# Patient Record
Sex: Female | Born: 1995 | Race: White | Hispanic: No | Marital: Single | Smoking: Never smoker
Health system: Southern US, Community
[De-identification: ages and names within clinical notes are randomized; demographics above are authoritative.]

## PROBLEM LIST (undated history)

## (undated) DIAGNOSIS — F419 Anxiety disorder, unspecified: Secondary | ICD-10-CM

## (undated) DIAGNOSIS — J45909 Unspecified asthma, uncomplicated: Secondary | ICD-10-CM

---
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
## 2017-06-26 ENCOUNTER — Ambulatory Visit (INDEPENDENT_AMBULATORY_CARE_PROVIDER_SITE_OTHER): Payer: BLUE CROSS/BLUE SHIELD | Admitting: Psychiatry

## 2017-06-26 ENCOUNTER — Encounter: Payer: Self-pay | Admitting: Psychiatry

## 2017-06-26 ENCOUNTER — Other Ambulatory Visit: Payer: Self-pay

## 2017-06-26 VITALS — BP 111/77 | HR 94 | Temp 97.7°F | Wt 90.0 lb

## 2017-06-26 DIAGNOSIS — J45909 Unspecified asthma, uncomplicated: Secondary | ICD-10-CM | POA: Insufficient documentation

## 2017-06-26 DIAGNOSIS — F411 Generalized anxiety disorder: Secondary | ICD-10-CM | POA: Diagnosis not present

## 2017-06-26 MED ORDER — ESCITALOPRAM OXALATE 5 MG PO TABS: 5.0000 mg | ORAL_TABLET | Freq: Every day | ORAL | 1 refills | Status: DC

## 2017-06-26 MED ORDER — HYDROXYZINE HCL 10 MG PO TABS: 10.0000 mg | ORAL_TABLET | Freq: Two times a day (BID) | ORAL | 1 refills | Status: DC | PRN

## 2017-06-26 NOTE — Progress Notes (Signed)
Psychiatric Initial Adult Assessment   Patient Identification: Natasha HuntsmanStephanie Diaz MRN:  [id number] Date of Evaluation:  06/26/2017 Referral Source: Imagene GurneyBruce Greene DO Chief Complaint:  ' I am anxious.' Chief Complaint    Establish Care; Anxiety     Visit Diagnosis:    ICD-10-CM   1. GAD (generalized anxiety disorder) F41.1 escitalopram (LEXAPRO) 5 MG tablet    hydrOXYzine (ATARAX/VISTARIL) 10 MG tablet    History of Present Illness:  Natasha Diaz is a 22 yr old CF, single ,who has a hx of GAD , is a Consulting civil engineerstudent at [employer] , originally from [place], presented to the clinic today to establish care.  Natasha Diaz today reports that she has been struggling with panic symptoms and anxiety attacks since the past 7 years or so.  She reports 7 years ago she had an asthma attack which turned into a panic attack.  She reports she had another panic attack several years ago when she had another stressor.  She reports she has not had any panic attacks ever since.  She however continues to struggle with anxiety symptoms.  She describes her anxiety symptoms as feeling nervous, anxious, on edge, not being able to stop worrying, trouble relaxing and having racing thoughts.  She reports that it does not happen a lot but she still struggles with it.  She reports she recently went abroad to [place] in January [place].  She reports that at that time she felt extremely nervous and on the edge.  She was prescribed BuSpar as needed by a psychiatrist in [place].  She reports she  tried taking the BuSpar and did not feel like it helped at all.  Natasha Diaz otherwise denies any significant problems.  She denies depressed.  She denies sleep problems.  She denies psychosis.  She denies manic or hypomanic symptoms.  She denies any past history of trauma.  She does drink alcohol socially on the weekends.  She denies abusing any other drugs or alcohol.  She is doing her third year of college at [employer], majoring in  Investment banker, corporatepolitical science at this time.  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    Associated Signs/Symptoms: Depression Symptoms:  difficulty concentrating, anxiety, panic attacks, (Hypo) Manic Symptoms:  denies Anxiety Symptoms:  nervousness Psychotic Symptoms:  denies PTSD Symptoms: Negative  Past Psychiatric History: Denies any history of inpatient mental health admissions.  She denies any history of suicide attempts.  Does report she was treated by a psychiatrist Dr. Nathanial MillmanBruce Green in [place].  She also got psychotherapy in the past while in [place].  Previous Psychotropic Medications: Yes ,BuSpar 5 mg as needed.  Substance Abuse History in the last 12 months:  No.  Consequences of Substance Abuse: Negative  Past Medical History:  Past Medical History:  Diagnosis Date  . Anxiety   . Asthma    History reviewed. No pertinent surgical history.  Family Psychiatric History: Sister-OCD, another sibling(transgender) -OCD, maternal grandmother-depression, maternal uncle-depression, paternal aunt-anxiety.  Family History:  Family History  Problem Relation Age of Onset  . Anxiety disorder Sister   . Anxiety disorder Paternal Aunt   . Depression Maternal Uncle   . Depression Maternal Grandmother   . Anxiety disorder Cousin   . Anxiety disorder Sister     Social History:   Social History   Socioeconomic History  . Marital status: Single    Spouse name: None  .  Number of children: 0  . Years of education: None  . Highest education level: Some college, no degree  Social Needs  . Financial resource strain: Not hard at all  . Food insecurity - worry: Never true  . Food insecurity - inability: Never true  . Transportation needs - medical: No  . Transportation needs - non-medical: No  Occupational History    Comment: part time  Tobacco Use  . Smoking status: Never Smoker  . Smokeless tobacco: Never Used   Substance and Sexual Activity  . Alcohol use: Yes    Alcohol/week: 3.0 oz    Types: 3 Glasses of wine, 1 Cans of beer, 1 Shots of liquor per week  . Drug use: No  . Sexual activity: Yes    Birth control/protection: Condom  Other Topics Concern  . None  Social History Narrative  . None    Additional Social History: She was born and raised in [place].  She reports her parents as very supportive.  She has 2 siblings and she has a good relationship with them.  She is currently in her third year of college at [employer].  She is Glass blower/designer in Investment banker, corporate.  She would like to start an internship in the summer and looks forward to starting a job possibly in [place].  Allergies:  No Known Allergies  Metabolic Disorder Labs: No results found for: HGBA1C, MPG No results found for: PROLACTIN No results found for: CHOL, TRIG, HDL, CHOLHDL, VLDL, LDLCALC   Current Medications: Current Outpatient Medications  Medication Sig Dispense Refill  . Albuterol (VENTOLIN IN) Inhale into the lungs.    . ciclesonide (ALVESCO) 80 MCG/ACT inhaler Inhale 1 puff into the lungs 2 (two) times daily.    Marland Kitchen escitalopram (LEXAPRO) 5 MG tablet Take 1 tablet (5 mg total) by mouth daily. 30 tablet 1  . hydrOXYzine (ATARAX/VISTARIL) 10 MG tablet Take 1-2 tablets (10-20 mg total) by mouth 2 (two) times daily as needed (only for severe anxiety sx). 120 tablet 1   No current facility-administered medications for this visit.     Neurologic: Headache: No Seizure: No Paresthesias:No  Musculoskeletal: Strength & Muscle Tone: within normal limits Gait & Station: normal Patient leans: N/A  Psychiatric Specialty Exam: Review of Systems  Psychiatric/Behavioral: The patient is nervous/anxious.   All other systems reviewed and are negative.   Blood pressure 111/77, pulse 94, temperature 97.7 F (36.5 C), temperature source Oral, weight 90 lb (40.8 kg), last menstrual period 06/12/2017.Body mass  index is 16.46 kg/m.  General Appearance: Casual  Eye Contact:  Fair  Speech:  Normal Rate  Volume:  Normal  Mood:  Anxious  Affect:  Congruent  Thought Process:  Goal Directed and Descriptions of Associations: Intact  Orientation:  Full (Time, Place, and Person)  Thought Content:  Logical  Suicidal Thoughts:  No  Homicidal Thoughts:  No  Memory:  Immediate;   Fair Recent;   Fair Remote;   Fair  Judgement:  Fair  Insight:  Fair  Psychomotor Activity:  Normal  Concentration:  Concentration: Fair and Attention Span: Fair  Recall:  [employer] of Knowledge:Fair  Language: Fair  Akathisia:  No  Handed:  Right  AIMS (if indicated): NA  Assets:  Communication Skills Desire for Improvement Financial Resources/Insurance Housing Leisure Time Physical Health Resilience Social Support Talents/Skills Transportation Vocational/Educational  ADL's:  Intact  Cognition: WNL  Sleep:  fair    Treatment Plan Summary: Khushboo is a 22 year old Caucasian female,  single, student at [employer], presented to the clinic today to establish care.  Tawanda has a history of generalized anxiety disorder.  She is currently not on any medication except for BuSpar as needed which she reports is not helpful.  She does have a biological predisposition given her family history of mental health problems.  She currently denies any history of trauma or any substance abuse problems.  Discussed medication readjustment as well as possible psychotherapy referral with patient.  Patient reports she is not interested in psychotherapy at this time.  Plan as noted below. Medication management and Plan as noted below Plan  GAD Start Lexapro 5 mg p.o. daily Provided medication education, provided handouts. Discussed the interaction between alcohol and medications like Lexapro. Will discontinue BuSpar for lack of efficacy. Start hydroxyzine 10-20 mg p.o. twice daily as needed  Discussed referral for CBT, she  reports she is not interested.  Will get labs-TSH if not already done.  Follow-up in clinic in 6 weeks or sooner if needed.  More than 50 % of the time was spent for psychoeducation and supportive psychotherapy and care coordination.  This note was generated in part or whole with voice recognition software. Voice recognition is usually quite accurate but there are transcription errors that can and very often do occur. I apologize for any typographical errors that were not detected and corrected.      Jomarie Longs, MD 3/14/20198:59 AM

## 2017-06-26 NOTE — Patient Instructions (Signed)
Hydroxyzine capsules or tablets What is this medicine? HYDROXYZINE (hye [place] i zeen) is an antihistamine. This medicine is used to treat allergy symptoms. It is also used to treat anxiety and tension. This medicine can be used with other medicines to induce sleep before surgery. This medicine may be used for other purposes; ask your health care provider or pharmacist if you have questions. COMMON BRAND NAME(S): ANX, Atarax, Rezine, Vistaril What should I tell my health care provider before I take this medicine? They need to know if you have any of these conditions: -any chronic illness -difficulty passing urine -glaucoma -heart disease -kidney disease -liver disease -lung disease -an unusual or allergic reaction to hydroxyzine, cetirizine, other medicines, foods, dyes, or preservatives -pregnant or trying to get pregnant -breast-feeding How should I use this medicine? Take this medicine by mouth with a full glass of water. Follow the directions on the prescription label. You may take this medicine with food or on an empty stomach. Take your medicine at regular intervals. Do not take your medicine more often than directed. Talk to your pediatrician regarding the use of this medicine in children. Special care may be needed. While this drug may be prescribed for children as young as 75 years of age for selected conditions, precautions do apply. Patients over 62 years old may have a stronger reaction and need a smaller dose. Overdosage: If you think you have taken too much of this medicine contact a poison control center or emergency room at once. NOTE: This medicine is only for you. Do not share this medicine with others. What if I miss a dose? If you miss a dose, take it as soon as you can. If it is almost time for your next dose, take only that dose. Do not take double or extra doses. What may interact with this medicine? -alcohol -barbiturate medicines for sleep or seizures -medicines for  colds, allergies -medicines for depression, anxiety, or emotional disturbances -medicines for pain -medicines for sleep -muscle relaxants This list may not describe all possible interactions. Give your health care provider a list of all the medicines, herbs, non-prescription drugs, or dietary supplements you use. Also tell them if you smoke, drink alcohol, or use illegal drugs. Some items may interact with your medicine. What should I watch for while using this medicine? Tell your doctor or health care professional if your symptoms do not improve. You may get drowsy or dizzy. Do not drive, use machinery, or do anything that needs mental alertness until you know how this medicine affects you. Do not stand or sit up quickly, especially if you are an older patient. This reduces the risk of dizzy or fainting spells. Alcohol may interfere with the effect of this medicine. Avoid alcoholic drinks. Your mouth may get dry. Chewing sugarless gum or sucking hard candy, and drinking plenty of water may help. Contact your doctor if the problem does not go away or is severe. This medicine may cause dry eyes and blurred vision. If you wear contact lenses you may feel some discomfort. Lubricating drops may help. See your eye doctor if the problem does not go away or is severe. If you are receiving skin tests for allergies, tell your doctor you are using this medicine. What side effects may I notice from receiving this medicine? Side effects that you should report to your doctor or health care professional as soon as possible: -fast or irregular heartbeat -difficulty passing urine -seizures -slurred speech or confusion -tremor Side effects that  usually do not require medical attention (report to your doctor or health care professional if they continue or are bothersome): -constipation -drowsiness -fatigue -headache -stomach upset This list may not describe all possible side effects. Call your doctor for  medical advice about side effects. You may report side effects to FDA at 1-800-FDA-1088. Where should I keep my medicine? Keep out of the reach of children. Store at room temperature between 15 and 30 degrees C (59 and 86 degrees F). Keep container tightly closed. Throw away any unused medicine after the expiration date. NOTE: This sheet is a summary. It may not cover all possible information. If you have questions about this medicine, talk to your doctor, pharmacist, or health care provider.  2018 Elsevier/Gold Standard (2007-08-15 14:50:59) Escitalopram tablets What is this medicine? ESCITALOPRAM (es sye TAL oh pram) is used to treat depression and certain types of anxiety. This medicine may be used for other purposes; ask your health care provider or pharmacist if you have questions. COMMON BRAND NAME(S): Lexapro What should I tell my health care provider before I take this medicine? They need to know if you have any of these conditions: -bipolar disorder or a family history of bipolar disorder -diabetes -glaucoma -heart disease -kidney or liver disease -receiving electroconvulsive therapy -seizures (convulsions) -suicidal thoughts, plans, or attempt by you or a family member -an unusual or allergic reaction to escitalopram, the related drug citalopram, other medicines, foods, dyes, or preservatives -pregnant or trying to become pregnant -breast-feeding How should I use this medicine? Take this medicine by mouth with a glass of water. Follow the directions on the prescription label. You can take it with or without food. If it upsets your stomach, take it with food. Take your medicine at regular intervals. Do not take it more often than directed. Do not stop taking this medicine suddenly except upon the advice of your doctor. Stopping this medicine too quickly may cause serious side effects or your condition may worsen. A special MedGuide will be given to you by the pharmacist with each  prescription and refill. Be sure to read this information carefully each time. Talk to your pediatrician regarding the use of this medicine in children. Special care may be needed. Overdosage: If you think you have taken too much of this medicine contact a poison control center or emergency room at once. NOTE: This medicine is only for you. Do not share this medicine with others. What if I miss a dose? If you miss a dose, take it as soon as you can. If it is almost time for your next dose, take only that dose. Do not take double or extra doses. What may interact with this medicine? Do not take this medicine with any of the following medications: -certain medicines for fungal infections like fluconazole, itraconazole, ketoconazole, posaconazole, voriconazole -cisapride -citalopram -dofetilide -dronedarone -linezolid -MAOIs like Carbex, Eldepryl, Marplan, Nardil, and Parnate -methylene blue (injected into a vein) -pimozide -thioridazine -ziprasidone This medicine may also interact with the following medications: -alcohol -amphetamines -aspirin and aspirin-like medicines -carbamazepine -certain medicines for depression, anxiety, or psychotic disturbances -certain medicines for migraine headache like almotriptan, eletriptan, frovatriptan, naratriptan, rizatriptan, sumatriptan, zolmitriptan -certain medicines for sleep -certain medicines that treat or prevent blood clots like warfarin, enoxaparin, dalteparin -cimetidine -diuretics -fentanyl -furazolidone -isoniazid -lithium -metoprolol -NSAIDs, medicines for pain and inflammation, like ibuprofen or naproxen -other medicines that prolong the QT interval (cause an abnormal heart rhythm) -procarbazine -rasagiline -supplements like St. John's wort, kava kava, valerian -tramadol -tryptophan  This list may not describe all possible interactions. Give your health care provider a list of all the medicines, herbs, non-prescription drugs,  or dietary supplements you use. Also tell them if you smoke, drink alcohol, or use illegal drugs. Some items may interact with your medicine. What should I watch for while using this medicine? Tell your doctor if your symptoms do not get better or if they get worse. Visit your doctor or health care professional for regular checks on your progress. Because it may take several weeks to see the full effects of this medicine, it is important to continue your treatment as prescribed by your doctor. Patients and their families should watch out for new or worsening thoughts of suicide or depression. Also watch out for sudden changes in feelings such as feeling anxious, agitated, panicky, irritable, hostile, aggressive, impulsive, severely restless, overly excited and hyperactive, or not being able to sleep. If this happens, especially at the beginning of treatment or after a change in dose, call your health care professional. Bonita QuinYou may get drowsy or dizzy. Do not drive, use machinery, or do anything that needs mental alertness until you know how this medicine affects you. Do not stand or sit up quickly, especially if you are an older patient. This reduces the risk of dizzy or fainting spells. Alcohol may interfere with the effect of this medicine. Avoid alcoholic drinks. Your mouth may get dry. Chewing sugarless gum or sucking hard candy, and drinking plenty of water may help. Contact your doctor if the problem does not go away or is severe. What side effects may I notice from receiving this medicine? Side effects that you should report to your doctor or health care professional as soon as possible: -allergic reactions like skin rash, itching or hives, swelling of the face, lips, or tongue -anxious -black, tarry stools -changes in vision -confusion -elevated mood, decreased need for sleep, racing thoughts, impulsive behavior -eye pain -fast, irregular heartbeat -feeling faint or lightheaded, falls -feeling  agitated, angry, or irritable -hallucination, loss of contact with reality -loss of balance or coordination -loss of memory -painful or prolonged erections -restlessness, pacing, inability to keep still -seizures -stiff muscles -suicidal thoughts or other mood changes -trouble sleeping -unusual bleeding or bruising -unusually weak or tired -vomiting Side effects that usually do not require medical attention (report to your doctor or health care professional if they continue or are bothersome): -changes in appetite -change in sex drive or performance -headache -increased sweating -indigestion, nausea -tremors This list may not describe all possible side effects. Call your doctor for medical advice about side effects. You may report side effects to FDA at 1-800-FDA-1088. Where should I keep my medicine? Keep out of reach of children. Store at room temperature between 15 and 30 degrees C (59 and 86 degrees F). Throw away any unused medicine after the expiration date. NOTE: This sheet is a summary. It may not cover all possible information. If you have questions about this medicine, talk to your doctor, pharmacist, or health care provider.  2018 Elsevier/Gold Standard (2015-09-05 13:20:23)

## 2017-06-27 ENCOUNTER — Encounter: Payer: Self-pay | Admitting: Psychiatry

## 2017-07-03 ENCOUNTER — Telehealth: Payer: Self-pay

## 2017-07-03 NOTE — Telephone Encounter (Signed)
pt states that she has not started taking the hydroxyzine or the lexapro and she was wondering if she can take buspar with that medication.   I asked pt who gave her the buspar and she stated her previous psych doctor.   She states she is feeling anxious

## 2017-07-03 NOTE — Telephone Encounter (Signed)
Yes, patient opted not to start meds that I gave her last visit since she was going away. She had told me she does not think buspar as helping and wanted to stop it. However , its OK to continue buspar along with those meds I gave her . pls let her know.thanks.

## 2017-07-05 NOTE — Telephone Encounter (Signed)
Two messages was left for pt.  1st message was for pt to call office back.  Since have not heard back from pt.  2nd message stated that it was ok for her to take buspar along with the other medication.

## 2017-07-28 IMAGING — CR DG ANKLE COMPLETE 3+V*L*
1 series · 3 of 3 positions shown · non-contrast
Comparison: None.

CLINICAL DATA: Jumping injury with lateral ankle pain, initial
encounter

EXAM:
LEFT ANKLE COMPLETE - 3+ VIEW

[Series 1: ap · 0.17mm/px · 3 of 3 slices shown]
[im 1/3]
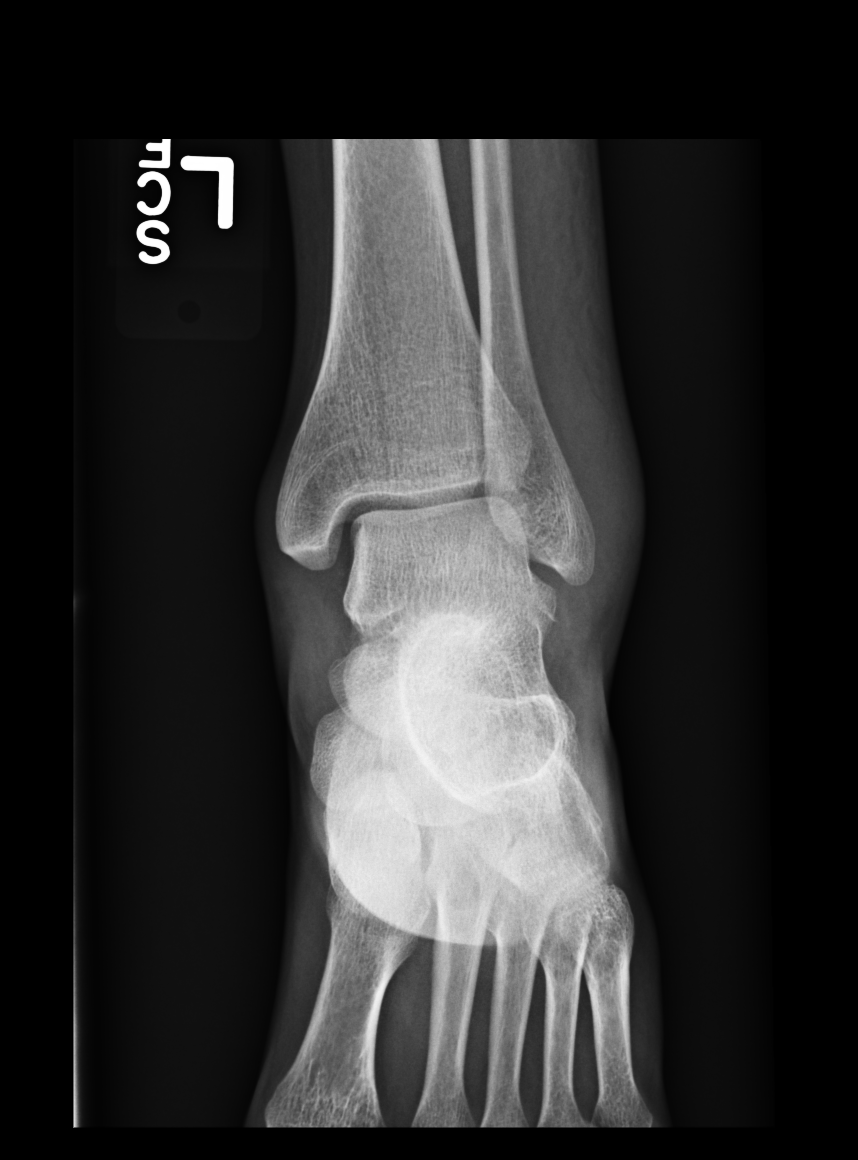
[im 2/3]
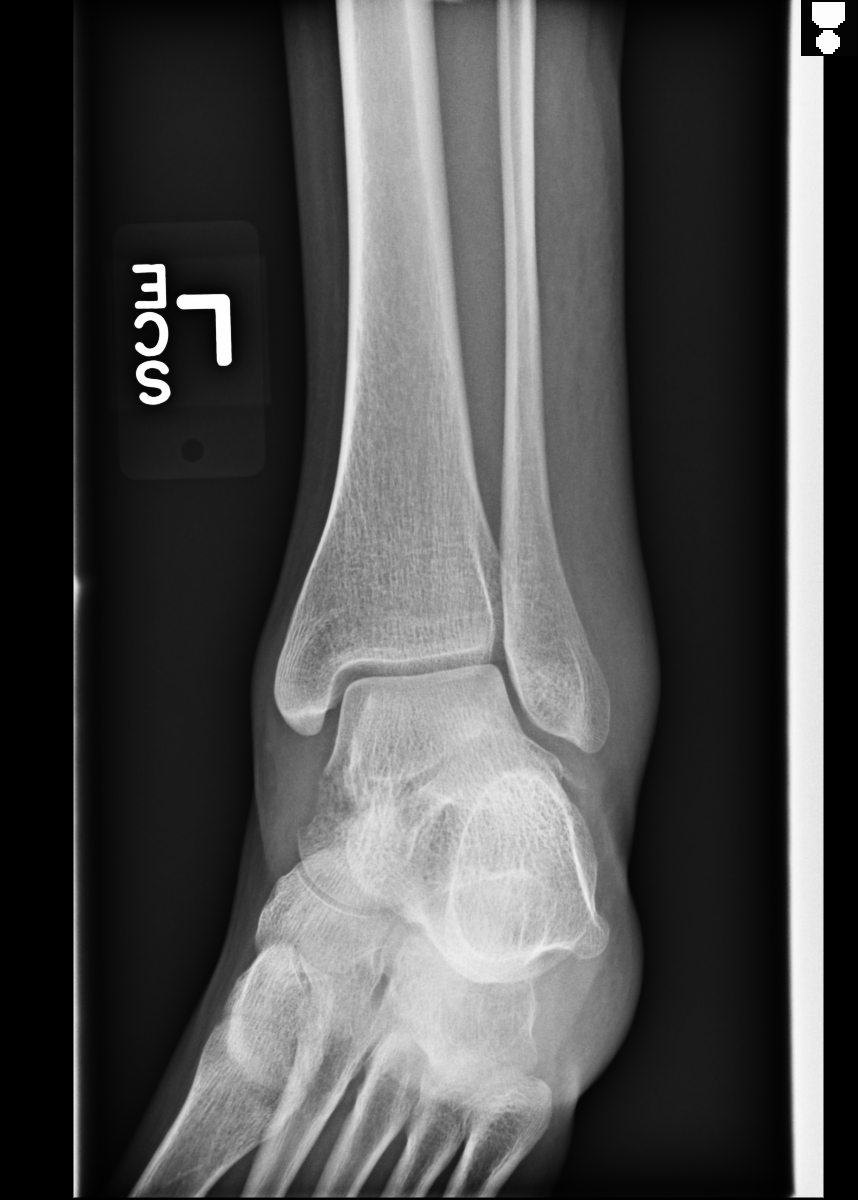
[im 3/3]
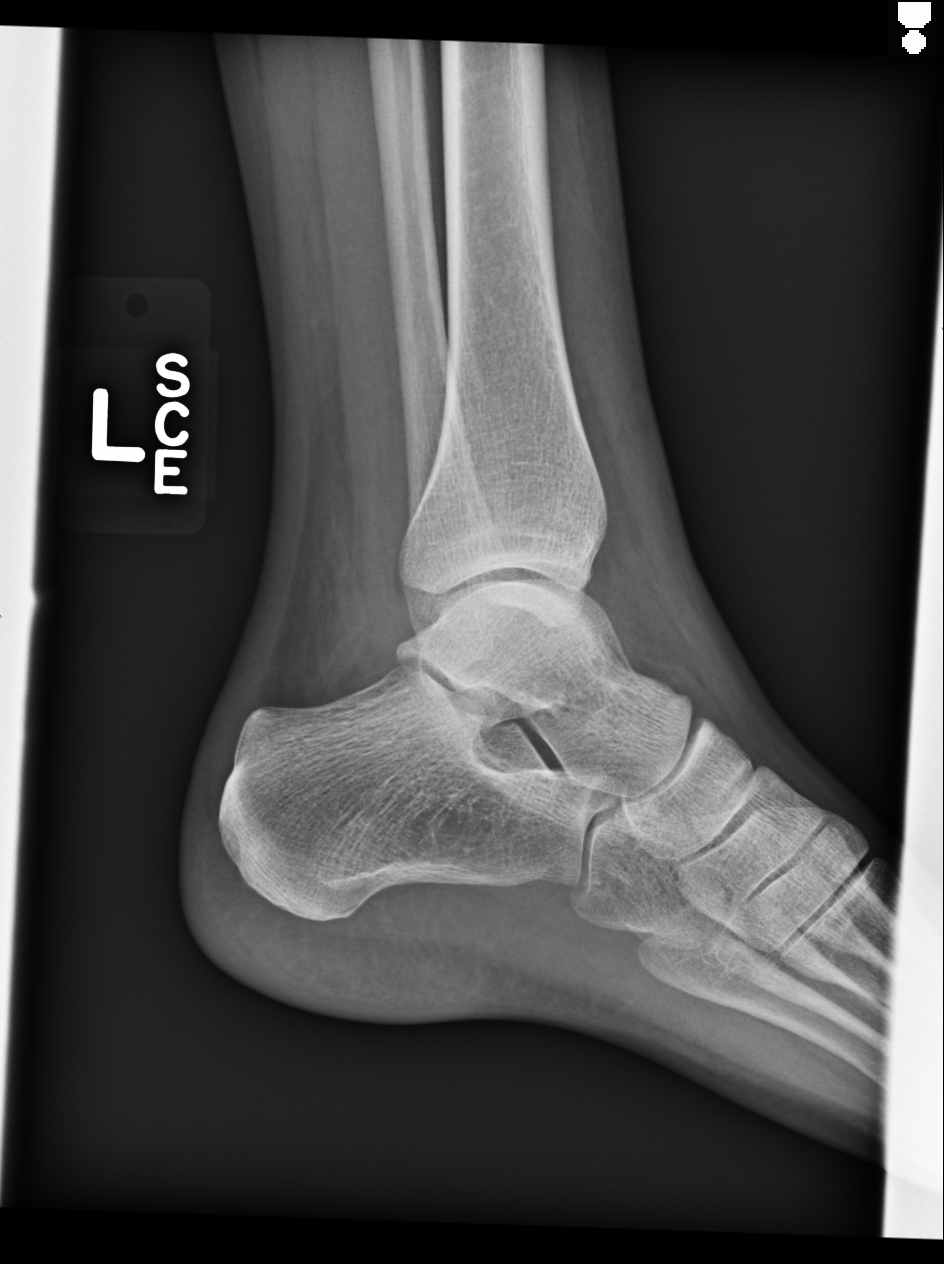

[3 of 3 positions shown; findings below may reference images not displayed]

FINDINGS: Considerable lateral soft tissue swelling is noted. Small bony
densities noted adjacent to the lateral aspect of talus. This may
represent a small avulsion. No other fractures are seen.
IMPRESSION: Likely small avulsion laterally from the talus, a soft tissue
swelling. No other fractures are seen.

## 2017-08-07 ENCOUNTER — Encounter: Payer: Self-pay | Admitting: Psychiatry

## 2017-08-07 ENCOUNTER — Other Ambulatory Visit: Payer: Self-pay

## 2017-08-07 ENCOUNTER — Ambulatory Visit (INDEPENDENT_AMBULATORY_CARE_PROVIDER_SITE_OTHER): Payer: BLUE CROSS/BLUE SHIELD | Admitting: Psychiatry

## 2017-08-07 VITALS — BP 111/76 | HR 96 | Temp 97.9°F | Wt 90.8 lb

## 2017-08-07 DIAGNOSIS — F411 Generalized anxiety disorder: Secondary | ICD-10-CM | POA: Diagnosis not present

## 2017-08-07 MED ORDER — HYDROXYZINE HCL 10 MG PO TABS: 10.0000 mg | ORAL_TABLET | Freq: Two times a day (BID) | ORAL | 2 refills | Status: AC | PRN

## 2017-08-07 MED ORDER — BUSPIRONE HCL 5 MG PO TABS: 5.0000 mg | ORAL_TABLET | Freq: Three times a day (TID) | ORAL | 0 refills | Status: AC

## 2017-08-07 NOTE — Patient Instructions (Signed)
Hydroxyzine capsules or tablets What is this medicine? HYDROXYZINE (hye [place] i zeen) is an antihistamine. This medicine is used to treat allergy symptoms. It is also used to treat anxiety and tension. This medicine can be used with other medicines to induce sleep before surgery. This medicine may be used for other purposes; ask your health care provider or pharmacist if you have questions. COMMON BRAND NAME(S): ANX, Atarax, Rezine, Vistaril What should I tell my health care provider before I take this medicine? They need to know if you have any of these conditions: -any chronic illness -difficulty passing urine -glaucoma -heart disease -kidney disease -liver disease -lung disease -an unusual or allergic reaction to hydroxyzine, cetirizine, other medicines, foods, dyes, or preservatives -pregnant or trying to get pregnant -breast-feeding How should I use this medicine? Take this medicine by mouth with a full glass of water. Follow the directions on the prescription label. You may take this medicine with food or on an empty stomach. Take your medicine at regular intervals. Do not take your medicine more often than directed. Talk to your pediatrician regarding the use of this medicine in children. Special care may be needed. While this drug may be prescribed for children as young as 75 years of age for selected conditions, precautions do apply. Patients over 62 years old may have a stronger reaction and need a smaller dose. Overdosage: If you think you have taken too much of this medicine contact a poison control center or emergency room at once. NOTE: This medicine is only for you. Do not share this medicine with others. What if I miss a dose? If you miss a dose, take it as soon as you can. If it is almost time for your next dose, take only that dose. Do not take double or extra doses. What may interact with this medicine? -alcohol -barbiturate medicines for sleep or seizures -medicines for  colds, allergies -medicines for depression, anxiety, or emotional disturbances -medicines for pain -medicines for sleep -muscle relaxants This list may not describe all possible interactions. Give your health care provider a list of all the medicines, herbs, non-prescription drugs, or dietary supplements you use. Also tell them if you smoke, drink alcohol, or use illegal drugs. Some items may interact with your medicine. What should I watch for while using this medicine? Tell your doctor or health care professional if your symptoms do not improve. You may get drowsy or dizzy. Do not drive, use machinery, or do anything that needs mental alertness until you know how this medicine affects you. Do not stand or sit up quickly, especially if you are an older patient. This reduces the risk of dizzy or fainting spells. Alcohol may interfere with the effect of this medicine. Avoid alcoholic drinks. Your mouth may get dry. Chewing sugarless gum or sucking hard candy, and drinking plenty of water may help. Contact your doctor if the problem does not go away or is severe. This medicine may cause dry eyes and blurred vision. If you wear contact lenses you may feel some discomfort. Lubricating drops may help. See your eye doctor if the problem does not go away or is severe. If you are receiving skin tests for allergies, tell your doctor you are using this medicine. What side effects may I notice from receiving this medicine? Side effects that you should report to your doctor or health care professional as soon as possible: -fast or irregular heartbeat -difficulty passing urine -seizures -slurred speech or confusion -tremor Side effects that  usually do not require medical attention (report to your doctor or health care professional if they continue or are bothersome): -constipation -drowsiness -fatigue -headache -stomach upset This list may not describe all possible side effects. Call your doctor for  medical advice about side effects. You may report side effects to FDA at 1-800-FDA-1088. Where should I keep my medicine? Keep out of the reach of children. Store at room temperature between 15 and 30 degrees C (59 and 86 degrees F). Keep container tightly closed. Throw away any unused medicine after the expiration date. NOTE: This sheet is a summary. It may not cover all possible information. If you have questions about this medicine, talk to your doctor, pharmacist, or health care provider.  2018 Elsevier/Gold Standard (2007-08-15 14:50:59) Buspirone tablets What is this medicine? BUSPIRONE (byoo SPYE rone) is used to treat anxiety disorders. This medicine may be used for other purposes; ask your health care provider or pharmacist if you have questions. COMMON BRAND NAME(S): BuSpar What should I tell my health care provider before I take this medicine? They need to know if you have any of these conditions: -kidney or liver disease -an unusual or allergic reaction to buspirone, other medicines, foods, dyes, or preservatives -pregnant or trying to get pregnant -breast-feeding How should I use this medicine? Take this medicine by mouth with a glass of water. Follow the directions on the prescription label. You may take this medicine with or without food. To ensure that this medicine always works the same way for you, you should take it either always with or always without food. Take your doses at regular intervals. Do not take your medicine more often than directed. Do not stop taking except on the advice of your doctor or health care professional. Talk to your pediatrician regarding the use of this medicine in children. Special care may be needed. Overdosage: If you think you have taken too much of this medicine contact a poison control center or emergency room at once. NOTE: This medicine is only for you. Do not share this medicine with others. What if I miss a dose? If you miss a dose, take  it as soon as you can. If it is almost time for your next dose, take only that dose. Do not take double or extra doses. What may interact with this medicine? Do not take this medicine with any of the following medications: -linezolid -MAOIs like Carbex, Eldepryl, Marplan, Nardil, and Parnate -methylene blue -procarbazine This medicine may also interact with the following medications: -diazepam -digoxin -diltiazem -erythromycin -grapefruit juice -haloperidol -medicines for mental depression or mood problems -medicines for seizures like carbamazepine, phenobarbital and phenytoin -nefazodone -other medications for anxiety -rifampin -ritonavir -some antifungal medicines like itraconazole, ketoconazole, and voriconazole -verapamil -warfarin This list may not describe all possible interactions. Give your health care provider a list of all the medicines, herbs, non-prescription drugs, or dietary supplements you use. Also tell them if you smoke, drink alcohol, or use illegal drugs. Some items may interact with your medicine. What should I watch for while using this medicine? Visit your doctor or health care professional for regular checks on your progress. It may take 1 to 2 weeks before your anxiety gets better. You may get drowsy or dizzy. Do not drive, use machinery, or do anything that needs mental alertness until you know how this drug affects you. Do not stand or sit up quickly, especially if you are an older patient. This reduces the risk of dizzy or fainting spells.  Alcohol can make you more drowsy and dizzy. Avoid alcoholic drinks. What side effects may I notice from receiving this medicine? Side effects that you should report to your doctor or health care professional as soon as possible: -blurred vision or other vision changes -chest pain -confusion -difficulty breathing -feelings of hostility or anger -muscle aches and pains -numbness or tingling in hands or feet -ringing in  the ears -skin rash and itching -vomiting -weakness Side effects that usually do not require medical attention (report to your doctor or health care professional if they continue or are bothersome): -disturbed dreams, nightmares -headache -nausea -restlessness or nervousness -sore throat and nasal congestion -stomach upset This list may not describe all possible side effects. Call your doctor for medical advice about side effects. You may report side effects to FDA at 1-800-FDA-1088. Where should I keep my medicine? Keep out of the reach of children. Store at room temperature below 30 degrees C (86 degrees F). Protect from light. Keep container tightly closed. Throw away any unused medicine after the expiration date. NOTE: This sheet is a summary. It may not cover all possible information. If you have questions about this medicine, talk to your doctor, pharmacist, or health care provider.  2018 [employer]vier/Gold Standard (2009-11-10 18:06:11)

## 2017-08-07 NOTE — Progress Notes (Signed)
BH MD OP Progress Note  08/07/2017 3:14 PM Natasha Diaz  MRN:  [id number]  Chief Complaint: ' I am here for follow up." Chief Complaint    Follow-up; Medication Refill     HPI: Natasha Diaz is a 22 year old Caucasian female, single, has a history of GAD, student at [employer], originally from [place], presented to the clinic today for a follow-up visit.  Patient today reports she has not had any significant panic symptoms or anxiety symptoms since the past 1 month or so.  She reports she did not take the Lexapro.  She reports she does not want to be on any medication that she has to take consistently.  She reports she continues to take BuSpar and she takes it as needed.  She also reports she would like to be tried back on the hydroxyzine which she can take as needed if her BuSpar does not work.  She reports so far she likes the effect of BuSpar and has been having good benefit from the same.  Patient reports sleep is good.  Patient denies any suicidality or perceptual disturbances.  Patient denies any significant substance abuse problems.  Patient reports her classes as going well.  She reports she is going to go home during summer break.  She plans to follow-up with her psychiatrist back home when she goes there.  She reports she is also planning to start internship in [place] during the summer.  She looks forward to the same. Visit Diagnosis:    ICD-10-CM   1. GAD (generalized anxiety disorder) F41.1     Past Psychiatric History: Denies any history of inpatient mental health admission.  She denies any history of suicide attempts.  Does report she was treated by a psychiatrist Dr. Nathanial Millman in [place].  She also got psychotherapy in the past while in [place].  Past Medical History:  Past Medical History:  Diagnosis Date  . Anxiety   . Asthma    History reviewed. No pertinent surgical history.  Family Psychiatric History: Sister-OCD, another sibling  (transgender)-OCD, maternal grandmother-depression, maternal uncle-depression, paternal aunt-anxiety.  Family History:  Family History  Problem Relation Age of Onset  . Anxiety disorder Sister   . Anxiety disorder Paternal Aunt   . Depression Maternal Uncle   . Depression Maternal Grandmother   . Anxiety disorder Cousin   . Anxiety disorder Sister     Social History: Born and raised in [place].  She reports her parents as very supportive.  She has 2 siblings.  She has a good relationship with them.  She is currently in her third year of college at [employer].  She has 1 more year to go.  She is Glass blower/designer in [employer], corporate.  She would like to start an internship in the summer in [place]. Social History   Socioeconomic History  . Marital status: Single    Spouse name: Not on file  . Number of children: 0  . Years of education: Not on file  . Highest education level: Some college, no degree  Occupational History    Comment: part time  Social Needs  . Financial resource strain: Not hard at all  . Food insecurity:    Worry: Never true    Inability: Never true  . Transportation needs:    Medical: No    Non-medical: No  Tobacco Use  . Smoking status: Never Smoker  . Smokeless tobacco: Never Used  Substance and Sexual Activity  . Alcohol use:  Yes    Alcohol/week: 3.0 oz    Types: 3 Glasses of wine, 1 Cans of beer, 1 Shots of liquor per week  . Drug use: No  . Sexual activity: Yes    Birth control/protection: Condom  Lifestyle  . Physical activity:    Days per week: 3 days    Minutes per session: 150+ min  . Stress: To some extent  Relationships  . Social connections:    Talks on phone: More than three times a week    Gets together: More than three times a week    Attends religious service: Never    Active member of club or organization: Yes    Attends meetings of clubs or organizations: More than 4 times per year    Relationship status: Separated  Other Topics  Concern  . Not on file  Social History Narrative  . Not on file    Allergies: No Known Allergies  Metabolic Disorder Labs: No results found for: HGBA1C, MPG No results found for: PROLACTIN No results found for: CHOL, TRIG, HDL, CHOLHDL, VLDL, LDLCALC No results found for: TSH  Therapeutic Level Labs: No results found for: LITHIUM No results found for: VALPROATE No components found for:  CBMZ  Current Medications: Current Outpatient Medications  Medication Sig Dispense Refill  . busPIRone (BUSPAR) 5 MG tablet Take 1 tablet (5 mg total) by mouth 3 (three) times daily. 270 tablet 0  . hydrOXYzine (ATARAX/VISTARIL) 10 MG tablet Take 1-2 tablets (10-20 mg total) by mouth 2 (two) times daily as needed. 120 tablet 2   No current facility-administered medications for this visit.      Musculoskeletal: Strength & Muscle Tone: within normal limits Gait & Station: normal Patient leans: N/A  Psychiatric Specialty Exam: Review of Systems  Psychiatric/Behavioral: The patient is nervous/anxious.   All other systems reviewed and are negative.   Blood pressure 111/76, pulse 96, temperature 97.9 F (36.6 C), temperature source Oral, weight 90 lb 12.8 oz (41.2 kg), last menstrual period 07/24/2017.Body mass index is 16.61 kg/m.  General Appearance: Casual  Eye Contact:  Good  Speech:  Clear and Coherent  Volume:  Normal  Mood:  Anxious  Affect:  Congruent  Thought Process:  Goal Directed and Descriptions of Associations: Intact  Orientation:  Full (Time, Place, and Person)  Thought Content: Logical   Suicidal Thoughts:  No  Homicidal Thoughts:  No  Memory:  Immediate;   Fair Recent;   Fair Remote;   Fair  Judgement:  Fair  Insight:  Fair  Psychomotor Activity:  Normal  Concentration:  Concentration: Fair and Attention Span: Fair  Recall:  FiservFair  Fund of Knowledge: Fair  Language: Fair  Akathisia:  No  Handed:  Right  AIMS (if indicated): NA  Assets:  Communication  Skills Desire for Improvement Social Support Talents/Skills Transportation  ADL's:  Intact  Cognition: WNL  Sleep:  Fair   Screenings:GAD 7   Assessment and Plan: Natasha Diaz is a 22 year old Caucasian female, single, Consulting [employer] at [employer] [place], presented to the clinic today for a follow-up visit.  Patient reports she continues to take the BuSpar and has not started the Lexapro as prescribed.  She reports she does not want to be on the Lexapro and would like something as needed for anxiety symptoms.  Discussed hydroxyzine.  Patient continues to do well at school.  She continues to have good support system.  Plan as noted below.  Plan GAD Discontinue Lexapro for noncompliance Restarted BuSpar  5 mg p.o. 3 times daily Start hydroxyzine 10-20 mg p.o. twice daily as needed GAD 7 =1  Patient continues to decline CBT, she is not interested.  Follow-up in clinic after she returns from her summer break.  In the meantime she will follow-up with her psychiatrist back home.  More than 50 % of the time was spent for psychoeducation and supportive psychotherapy and care coordination.  This note was generated in part or whole with voice recognition software. Voice recognition is usually quite accurate but there are transcription errors that can and very often do occur. I apologize for any typographical errors that were not detected and corrected.        Jomarie Longs, MD 08/07/2017, 9:35 PM
